# Patient Record
Sex: Female | Born: 1970 | Race: White | Hispanic: No | Marital: Married | Smoking: Current every day smoker
Health system: Southern US, Community
[De-identification: ages and names within clinical notes are randomized; demographics above are authoritative.]

## PROBLEM LIST (undated history)

## (undated) DIAGNOSIS — Q278 Other specified congenital malformations of peripheral vascular system: Secondary | ICD-10-CM

---

## 2004-03-15 ENCOUNTER — Other Ambulatory Visit: Payer: Self-pay

## 2004-12-02 ENCOUNTER — Emergency Department: Payer: Self-pay | Admitting: Emergency Medicine

## 2010-03-18 ENCOUNTER — Emergency Department: Payer: Self-pay | Admitting: Unknown Physician Specialty

## 2011-02-28 ENCOUNTER — Emergency Department: Payer: Self-pay | Admitting: Unknown Physician Specialty

## 2011-02-28 ENCOUNTER — Emergency Department: Payer: Self-pay | Admitting: Emergency Medicine

## 2012-04-02 ENCOUNTER — Emergency Department: Payer: Self-pay | Admitting: Emergency Medicine

## 2012-05-01 ENCOUNTER — Emergency Department: Payer: Self-pay | Admitting: Emergency Medicine

## 2012-05-01 LAB — BASIC METABOLIC PANEL
BUN: 12 mg/dL (ref 7–18)
Calcium, Total: 8.1 mg/dL — ABNORMAL LOW (ref 8.5–10.1)
Chloride: 109 mmol/L — ABNORMAL HIGH (ref 98–107)
Creatinine: 0.68 mg/dL (ref 0.60–1.30)
Glucose: 100 mg/dL — ABNORMAL HIGH (ref 65–99)
Osmolality: 281 (ref 275–301)
Potassium: 3.6 mmol/L (ref 3.5–5.1)
Sodium: 141 mmol/L (ref 136–145)

## 2012-05-01 LAB — PROTIME-INR
INR: 1.5
Prothrombin Time: 18.1 secs — ABNORMAL HIGH (ref 11.5–14.7)

## 2012-05-01 LAB — CBC
MCH: 28.6 pg (ref 26.0–34.0)
MCHC: 33.6 g/dL (ref 32.0–36.0)
MCV: 85 fL (ref 80–100)
Platelet: 176 10*3/uL (ref 150–440)
RDW: 13.7 % (ref 11.5–14.5)

## 2012-10-14 ENCOUNTER — Emergency Department: Payer: Self-pay | Admitting: Emergency Medicine

## 2012-11-11 ENCOUNTER — Emergency Department: Payer: Self-pay | Admitting: Emergency Medicine

## 2012-11-11 LAB — RAPID INFLUENZA A&B ANTIGENS

## 2013-02-28 ENCOUNTER — Emergency Department: Payer: Self-pay | Admitting: Emergency Medicine

## 2013-03-02 ENCOUNTER — Emergency Department: Payer: Self-pay | Admitting: Emergency Medicine

## 2013-05-24 ENCOUNTER — Emergency Department: Payer: Self-pay | Admitting: Emergency Medicine

## 2013-06-26 ENCOUNTER — Emergency Department: Payer: Self-pay | Admitting: Internal Medicine

## 2013-06-26 LAB — PROTIME-INR: Prothrombin Time: 18.3 secs — ABNORMAL HIGH (ref 11.5–14.7)

## 2013-06-26 LAB — BASIC METABOLIC PANEL
Calcium, Total: 8.7 mg/dL (ref 8.5–10.1)
Chloride: 108 mmol/L — ABNORMAL HIGH (ref 98–107)
Co2: 19 mmol/L — ABNORMAL LOW (ref 21–32)
Creatinine: 0.6 mg/dL (ref 0.60–1.30)
Glucose: 93 mg/dL (ref 65–99)
Osmolality: 275 (ref 275–301)

## 2013-06-26 LAB — CBC WITH DIFFERENTIAL/PLATELET
Basophil %: 1 %
Eosinophil %: 2.4 %
HCT: 42 % (ref 35.0–47.0)
HGB: 14.8 g/dL (ref 12.0–16.0)
Lymphocyte #: 1.4 10*3/uL (ref 1.0–3.6)
MCV: 84 fL (ref 80–100)
Neutrophil %: 72.4 %
Platelet: 209 10*3/uL (ref 150–440)
WBC: 8.1 10*3/uL (ref 3.6–11.0)

## 2013-10-02 IMAGING — US US EXTREM LOW VENOUS*L*
1 series · 14 of 24 positions shown · non-contrast
Comparison: none

REASON FOR EXAM: pain/edema non-palble Post-Tibial pulse
COMMENTS:   LMP: BTL

[Series 1: us extrem low venous*left* · 0.09mm/px · 14 of 26 slices shown]
[im 1/26]
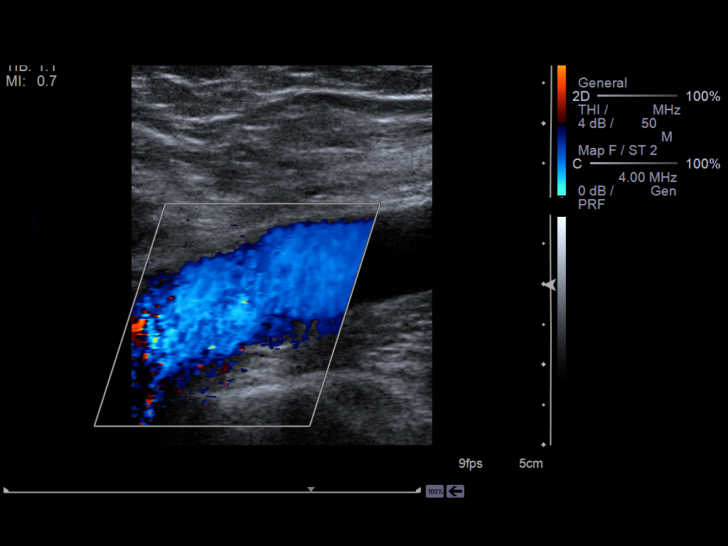
[im 3/26]
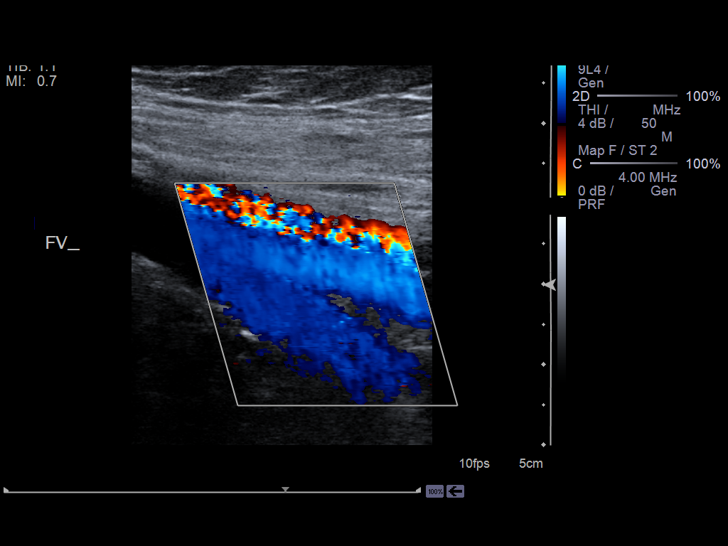
[im 5/26]
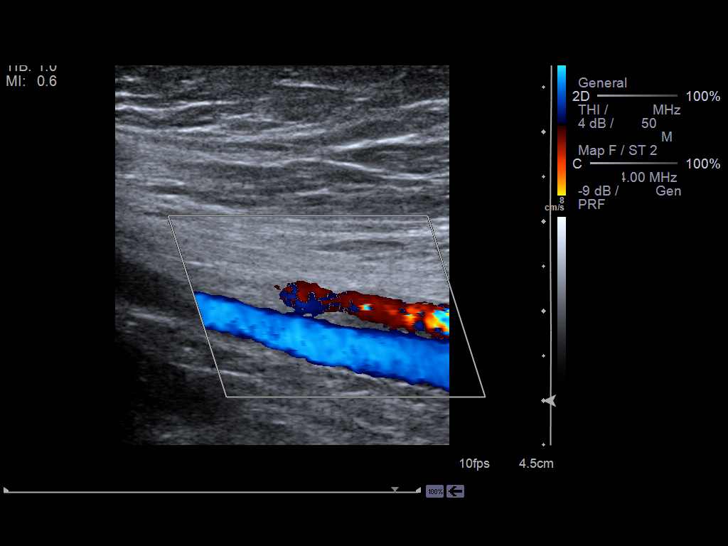
[im 7/26]
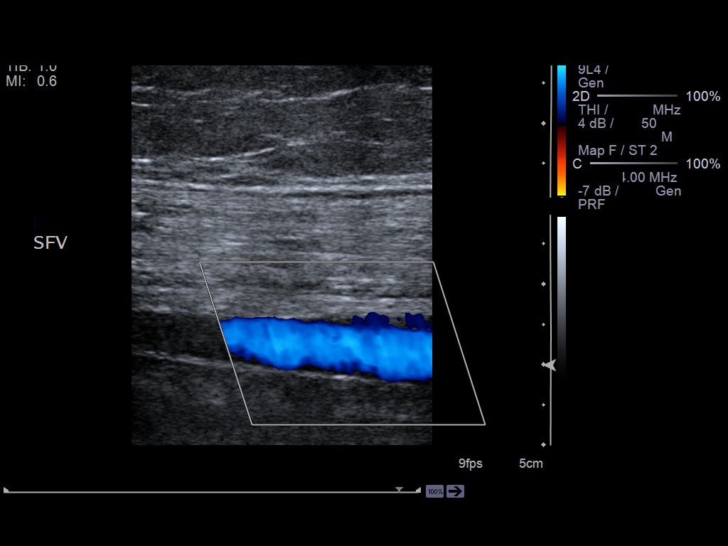
[im 8/26]
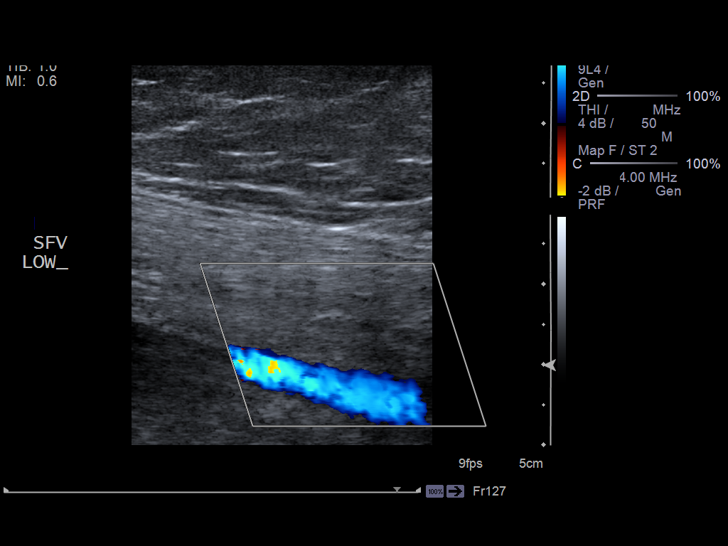
[im 10/26]
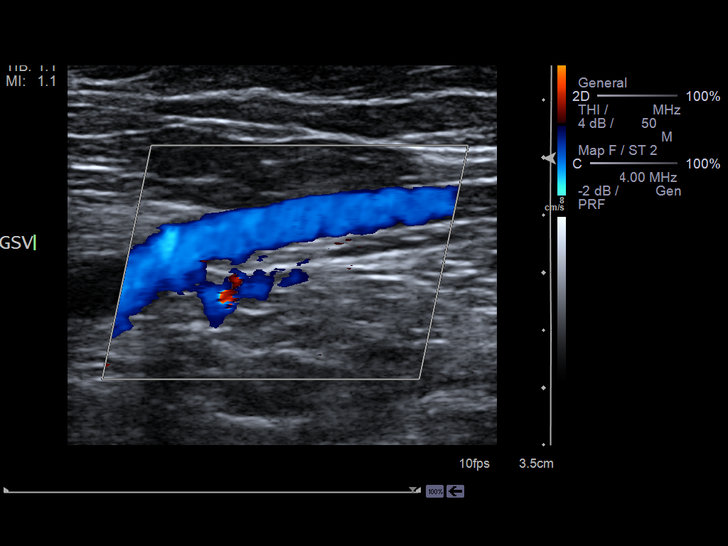
[im 12/26]
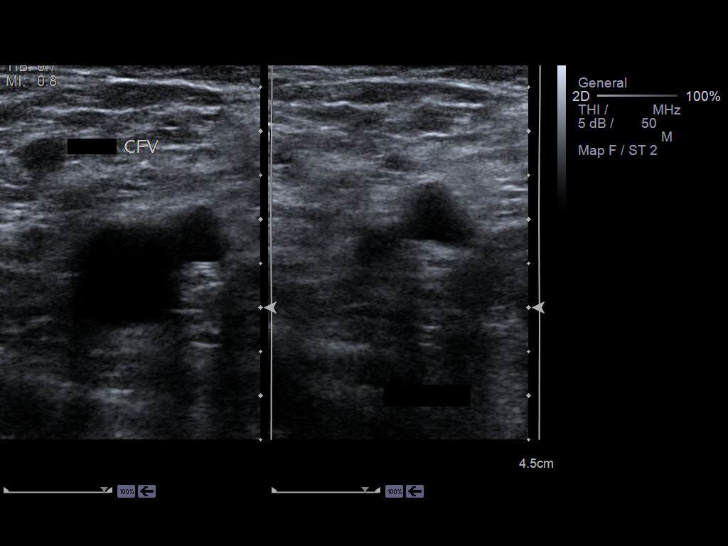
[im 14/26]
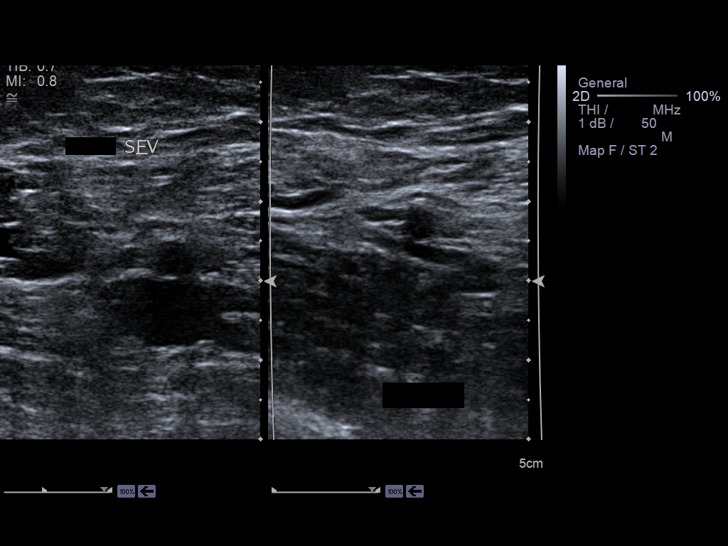
[im 16/26]
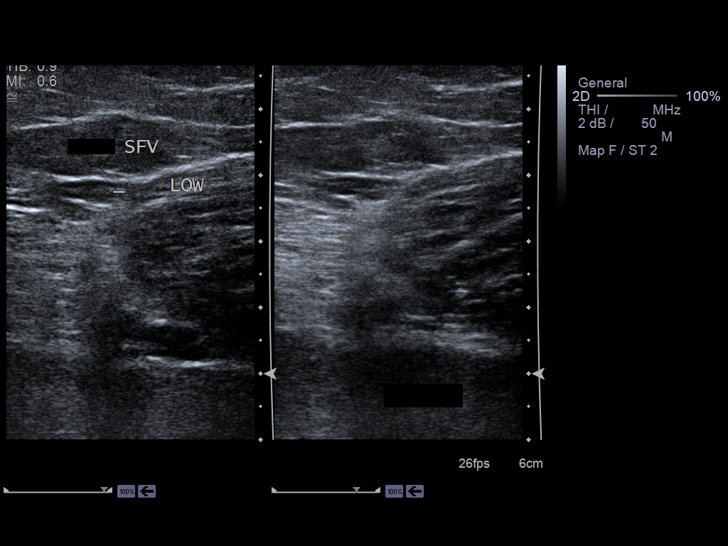
[im 18/26]
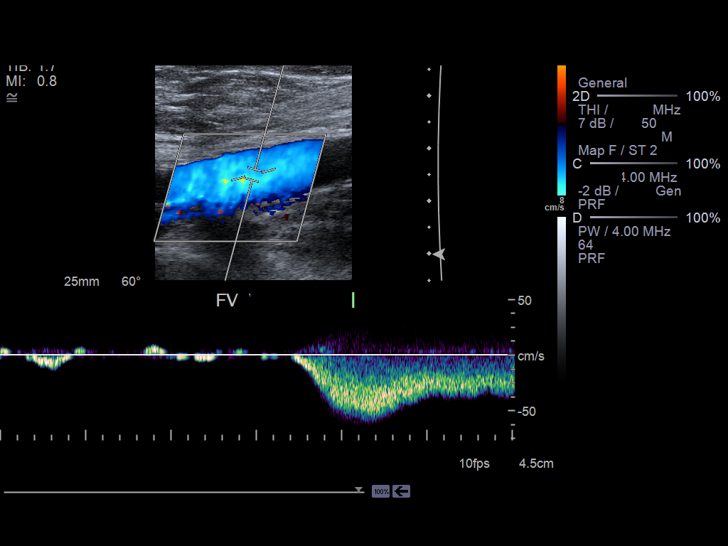
[im 20/26]
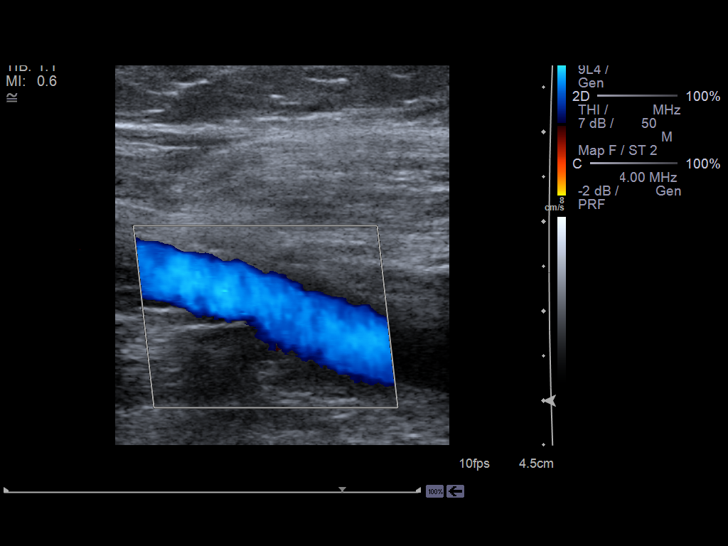
[im 21/26]
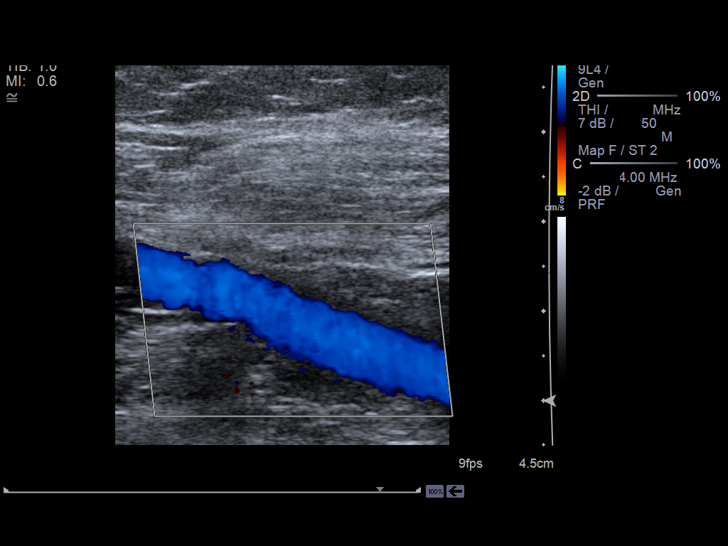
[im 23/26]
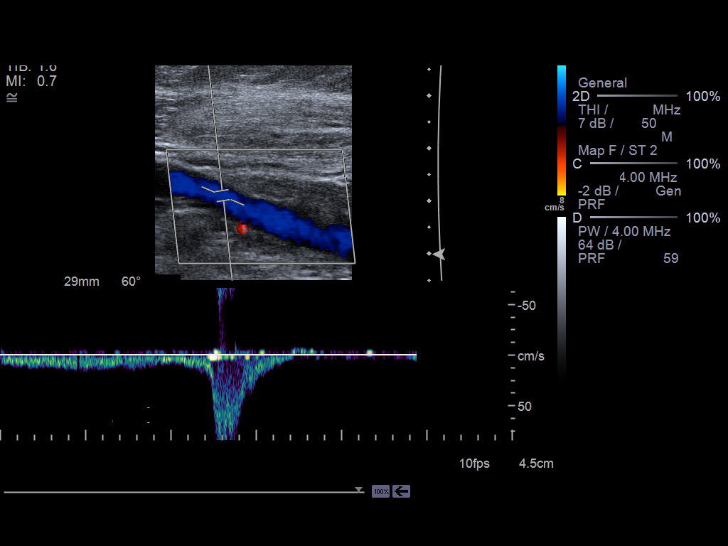
[im 26/26]
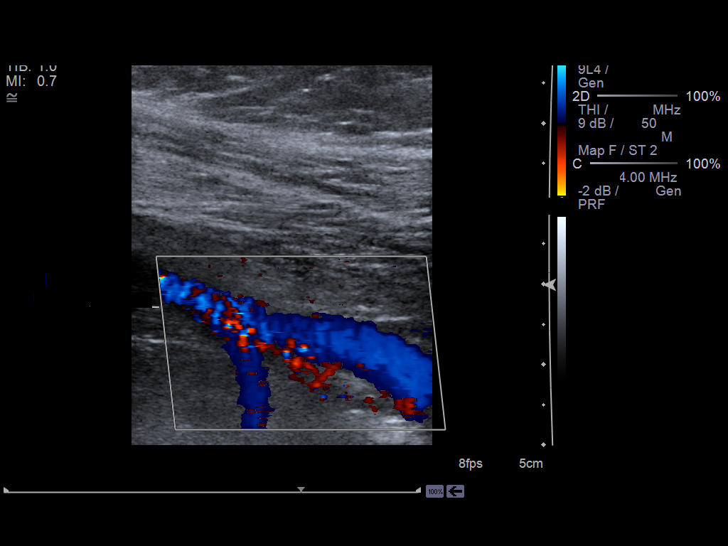

[14 of 24 positions shown; findings below may reference images not displayed]

PROCEDURE:     US  - US DOPPLER LOW EXTR LEFT  - October 14, 2012 [DATE]

RESULT:     Grayscale and color flow Doppler techniques were employed to
evaluate the deep veins of the left lower extremity.

The left common femoral, superficial femoral, popliteal veins are normally
compressible. The waveform patterns are normal and the color flow images are
normal. The response to the augmentation and Valsalva maneuvers is normal.
IMPRESSION: There is no evidence of thrombus within the left femoral or
popliteal veins.

A preliminary report was sent to the [HOSPITAL] the conclusion
of the study.

[REDACTED]

## 2015-09-21 ENCOUNTER — Encounter: Payer: Self-pay | Admitting: *Deleted

## 2015-09-21 ENCOUNTER — Emergency Department
Admission: EM | Admit: 2015-09-21 | Discharge: 2015-09-21 | Disposition: A | Discharge status: Home / Self Care | Payer: Medicare Other | Attending: Emergency Medicine | Admitting: Emergency Medicine

## 2015-09-21 ENCOUNTER — Emergency Department: Payer: Medicare Other

## 2015-09-21 DIAGNOSIS — Y9389 Activity, other specified: Secondary | ICD-10-CM | POA: Diagnosis not present

## 2015-09-21 DIAGNOSIS — Y92009 Unspecified place in unspecified non-institutional (private) residence as the place of occurrence of the external cause: Secondary | ICD-10-CM | POA: Insufficient documentation

## 2015-09-21 DIAGNOSIS — S59902A Unspecified injury of left elbow, initial encounter: Secondary | ICD-10-CM | POA: Diagnosis present

## 2015-09-21 DIAGNOSIS — M79632 Pain in left forearm: Secondary | ICD-10-CM

## 2015-09-21 DIAGNOSIS — M7989 Other specified soft tissue disorders: Secondary | ICD-10-CM

## 2015-09-21 DIAGNOSIS — F17200 Nicotine dependence, unspecified, uncomplicated: Secondary | ICD-10-CM | POA: Insufficient documentation

## 2015-09-21 DIAGNOSIS — Y998 Other external cause status: Secondary | ICD-10-CM | POA: Insufficient documentation

## 2015-09-21 DIAGNOSIS — S5002XA Contusion of left elbow, initial encounter: Secondary | ICD-10-CM

## 2015-09-21 DIAGNOSIS — W228XXA Striking against or struck by other objects, initial encounter: Secondary | ICD-10-CM | POA: Diagnosis not present

## 2015-09-21 DIAGNOSIS — S59912A Unspecified injury of left forearm, initial encounter: Secondary | ICD-10-CM | POA: Diagnosis not present

## 2015-09-21 HISTORY — DX: Other specified congenital malformations of peripheral vascular system: Q27.8

## 2015-09-21 MED ORDER — KETOROLAC TROMETHAMINE 10 MG PO TABS: 10.0000 mg | ORAL_TABLET | Freq: Three times a day (TID) | ORAL | Status: AC

## 2015-09-21 MED ORDER — KETOROLAC TROMETHAMINE 60 MG/2ML IM SOLN
60.0000 mg | Freq: Once | INTRAMUSCULAR | Status: AC
  Administered 2015-09-21: 60 mg via INTRAMUSCULAR
  Filled 2015-09-21: qty 2

## 2015-09-21 MED ORDER — CYCLOBENZAPRINE HCL 5 MG PO TABS: 5.0000 mg | ORAL_TABLET | Freq: Three times a day (TID) | ORAL | Status: AC | PRN

## 2015-09-21 NOTE — ED Notes (Signed)
Per pt pharmacy ketorolac not covered by pt insurance. [facility] [place] [facility] , one by mouth twice daily, dispense 30. Called to pt pharmacy.

## 2015-09-21 NOTE — ED Notes (Signed)
Per pt pharmacy, ketorolac not covered by insurance provider. [facility] [place] [facility], one by mouth twice daily, dispense 30. Order phoned to pt pharmacy.

## 2015-09-21 NOTE — ED Notes (Signed)
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

## 2015-09-21 NOTE — ED Provider Notes (Signed)
[facility] Emergency Department Provider Note ____________________________________________  Time seen: 1515  I have reviewed the triage vital signs and the nursing notes.  HISTORY  Chief Complaint  Arm Pain  HPI Kara Kaiser is a 45 y.o. female presents to the ED for evaluation of left arm pain at that she accidentally bumped her elbow about 2 weeks prior. She describes she was at home, did not actually hit the back part of her elbow on the wall. Since that time she's had some increased pain at the elbow as well as some swelling that she describes into the palm of the hand. She describes an unusual disorder that she calls the [facility], which stands for venous mucocutaneous malformations. She describes the disorder causes her to develop tumors in her veins and on her nerves or tendons. She thinks that the disorder may have flare following her operation with a contusion. She denies any change in the last 2 weeks with her daily dosing of morphine and Percocet. She has tried ibuprofen without relief. She reports here to the ED she claims for perhaps a prescription for anti-inflammatory or something that might help her pain. She is not been evaluated by her primary care provider since the injury. She claims is also the first time she's had such a contusion since her diagnosis in 2004. She claims pains her to extend the arm fully causing pain not as much in the elbow as it is into the forearm and swelling into the hand. She rates her pain at 10/10 in triage.  Past Medical History  Diagnosis Date  . Multiple cutaneous and mucosal venous malformations    There are no active problems to display for this patient.  History reviewed. No pertinent past surgical history.  Current Outpatient Rx  Name  Route  Sig  Dispense  Refill  . cyclobenzaprine (FLEXERIL) 5 MG tablet   Oral   Take 1 tablet (5 mg total) by mouth 3 (three) times daily as needed for muscle spasms.   15 tablet    0   . ketorolac (TORADOL) 10 MG tablet   Oral   Take 1 tablet (10 mg total) by mouth every 8 (eight) hours.   15 tablet   0    Allergies Review of patient's allergies indicates no known allergies.  History reviewed. No pertinent family history.  Social History Social History  Substance Use Topics  . Smoking status: Current Every Day Smoker  . Smokeless tobacco: None  . Alcohol Use: None   Review of Systems  Constitutional: Negative for fever. Eyes: Negative for visual changes. ENT: Negative for sore throat. Cardiovascular: Negative for chest pain. Respiratory: Negative for shortness of breath. Gastrointestinal: Negative for abdominal pain, vomiting and diarrhea. Genitourinary: Negative for dysuria. Musculoskeletal: Negative for back pain. Left arm pain and disability as above Skin: Negative for rash. Neurological: Negative for headaches, focal weakness or numbness. ____________________________________________  PHYSICAL EXAM:  VITAL SIGNS: ED Triage Vitals  Enc Vitals Group     BP 09/21/15 1346 134/84 mmHg     Pulse Rate 09/21/15 1346 85     Resp 09/21/15 1346 18     Temp 09/21/15 1346 98.1 F (36.7 C)     Temp Source 09/21/15 1346 Oral     SpO2 09/21/15 1346 98 %     Weight 09/21/15 1346 175 lb (79.379 kg)     Height 09/21/15 1346 5\' 4"  (1.626 m)     Head Cir --  Peak Flow --      Pain Score 09/21/15 1347 10     Pain Loc --      Pain Edu? --      Excl. in GC? --    Constitutional: Alert and oriented. Well appearing and in no distress. Head: Normocephalic and atraumatic.      Eyes: Conjunctivae are normal. PERRL. Normal extraocular movements      Ears: Canals clear. TMs intact bilaterally.   Nose: No congestion/rhinorrhea.   Mouth/Throat: Mucous membranes are moist.   Neck: Supple. No thyromegaly. Hematological/Lymphatic/Immunological: No cervical lymphadenopathy. Cardiovascular: Normal rate, regular rhythm. Normal distal pulses and cap  refill Respiratory: Normal respiratory effort. No wheezes/rales/rhonchi. Gastrointestinal: Soft and nontender. No distention. Musculoskeletal: Left upper extremity without any obvious deformity, swelling, edema, or erythema. Patient with normal shoulder range of motion exam. She is also able to demonstrate normal flexion and near full extension range at the elbow. Nontender with normal range of motion in all extremities.  Neurologic: Cranial nerves II through XII grossly intact. Normal intrinsic and opposition testing. Normal composite fist on the left. Normal UE DTRs bilaterally. Normal gait without ataxia. Normal speech and language. No gross focal neurologic deficits are appreciated. Skin:  Skin is warm, dry and intact. No rash noted. Psychiatric: Mood and affect are normal. Patient exhibits appropriate insight and judgment. ____________________________________________   RADIOLOGY LUE Venous Doppler IMPRESSION: No evidence of left upper extremity deep venous thrombosis. Right subclavian vein also patent. ____________________________________________  PROCEDURES  Toradol 60 mg IM ____________________________________________  INITIAL IMPRESSION / ASSESSMENT AND PLAN / ED COURSE  Patient with an elbow contusion and prescription of complaints of swelling to the distal forearm. She is unable normal physical exam and an upper extremity venous Doppler without evidence of DVT. She will be discharged with a prescription for ketorolac and cyclobenzaprine pills as directed. She'll continue with her home medications including morphine and Percocet daily. She will follow-up with her primary care provider for ongoing symptoms. ____________________________________________  FINAL CLINICAL IMPRESSION(S) / ED DIAGNOSES  Final diagnoses:  Pain and swelling of forearm, left  Left elbow contusion, initial encounter      Lissa Hoard, PA-C 09/21/15 1734  Loleta Rose, MD 09/21/15 2358

## 2015-09-21 NOTE — Discharge Instructions (Signed)
Elbow Contusion  An elbow contusion is a deep bruise of the elbow. Contusions happen when an injury causes bleeding under the skin. Signs of bruising include pain, puffiness (swelling), and discolored skin. The contusion may turn blue, purple, or yellow. HOME CARE  Put ice on the injured area.  Put ice in a plastic bag.  Place a towel between your skin and the bag.  Leave the ice on for 15-20 minutes, 03-04 times a day.  Only take medicines as told by your doctor.  Rest your elbow until the pain and puffiness are better.  Raise (elevate) your elbow to lessen puffiness.  Put on an elastic wrap as told by your doctor. You can take it off for sleeping, showers, and baths. If your fingers get cold, blue, or lose feeling (numb), take the wrap off. Put the wrap back on more loosely.  Use your elbow only as told by your doctor. If you are asked to do elbow exercises, do them as told.  Keep all doctor visits as told. GET HELP RIGHT AWAY IF:  You have more redness, puffiness, or pain in your elbow.  Your puffiness or pain is not helped by medicines.  You have puffiness of the hand and fingers.  You are not able to move your fingers or wrist.  You start to lose feeling in your hand or fingers.  Your fingers or hand become cold or blue. MAKE SURE YOU:   Understand these instructions.  Will watch your condition.  Will get help right away if you are not doing well or get worse.   This information is not intended to replace advice given to you by your health care provider. Make sure you discuss any questions you have with your health care provider.   Document Released: 08/15/2011 Document Revised: 02/25/2012 Document Reviewed: 04/10/2015 Elsevier Interactive Patient Education 2016 [employer]  Musculoskeletal Pain Musculoskeletal pain is muscle and boney aches and pains. These pains can occur in any part of the body. Your caregiver may treat you without knowing the cause of the  pain. They may treat you if blood or urine tests, X-rays, and other tests were normal.  CAUSES There is often not a definite cause or reason for these pains. These pains may be caused by a type of germ (virus). The discomfort may also come from overuse. Overuse includes working out too hard when your body is not fit. Boney aches also come from weather changes. Bone is sensitive to atmospheric pressure changes. HOME CARE INSTRUCTIONS   Ask when your test results will be ready. Make sure you get your test results.  Only take over-the-counter or prescription medicines for pain, discomfort, or fever as directed by your caregiver. If you were given medications for your condition, do not drive, operate machinery or power tools, or sign legal documents for 24 hours. Do not drink alcohol. Do not take sleeping pills or other medications that may interfere with treatment.  Continue all activities unless the activities cause more pain. When the pain lessens, slowly resume normal activities. Gradually increase the intensity and duration of the activities or exercise.  During periods of severe pain, bed rest may be helpful. Lay or sit in any position that is comfortable.  Putting ice on the injured area.  Put ice in a bag.  Place a towel between your skin and the bag.  Leave the ice on for 15 to 20 minutes, 3 to 4 times a day.  Follow up with your caregiver  for continued problems and no reason can be found for the pain. If the pain becomes worse or does not go away, it may be necessary to repeat tests or do additional testing. Your caregiver may need to look further for a possible cause. SEEK IMMEDIATE MEDICAL CARE IF:  You have pain that is getting worse and is not relieved by medications.  You develop chest pain that is associated with shortness or breath, sweating, feeling sick to your stomach (nauseous), or throw up (vomit).  Your pain becomes localized to the abdomen.  You develop any new  symptoms that seem different or that concern you. MAKE SURE YOU:   Understand these instructions.  Will watch your condition.  Will get help right away if you are not doing well or get worse.   This information is not intended to replace advice given to you by your health care provider. Make sure you discuss any questions you have with your health care provider.   Document Released: 08/26/2005 Document Revised: 11/18/2011 Document Reviewed: 04/30/2013 Elsevier Interactive Patient Education [employer]  Your exam and ultrasound was normal today. Take your home medicines and the prescribed anti-inflammatory + muscle relaxant as needed. Follow-up with your provider for ongoing symptoms.

## 2015-09-21 NOTE — ED Notes (Signed)
Assess per PA 

## 2016-09-08 IMAGING — US US EXTREM  UP VENOUS*L*
1 series · 13 of 24 positions shown · non-contrast
Comparison: None.

CLINICAL DATA: Left upper extremity pain and edema for 2 weeks

EXAM:
LEFT UPPER EXTREMITY VENOUS DUPLEX ULTRASOUND
TECHNIQUE: Gray-scale sonography with graded compression, as well as color
Doppler and duplex ultrasound were performed to evaluate the left
upper extremity deep venous system from the level of the subclavian
vein and including the jugular, axillary, basilic, radial, ulnar and
upper cephalic vein. Spectral Doppler was utilized to evaluate flow
at rest and with distal augmentation maneuvers.

[Series 1: us extrem up venous*left* · 0.07mm/px · 13 of 33 slices shown]
[im 1/33]
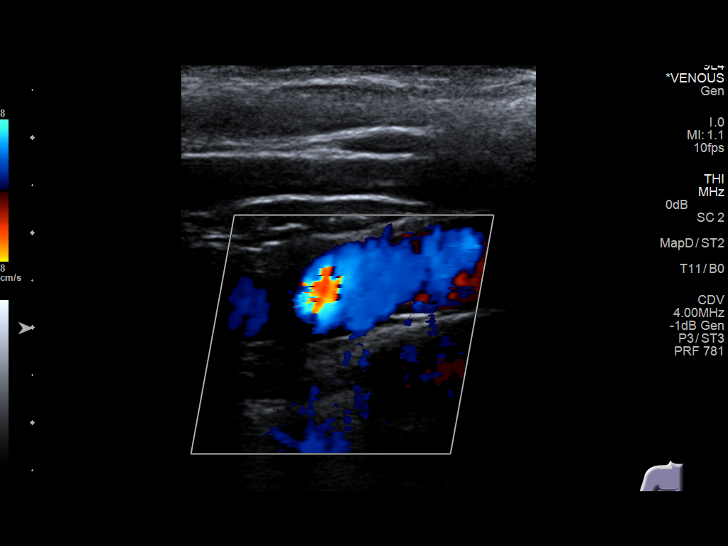
[im 3/33]
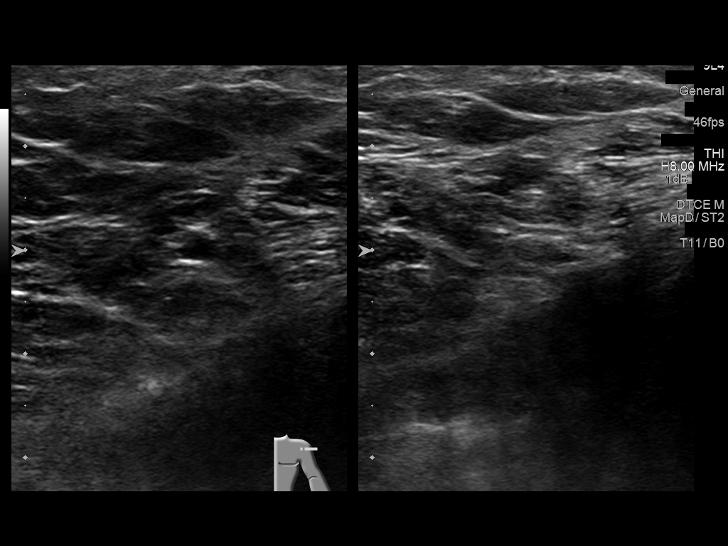
[im 6/33]
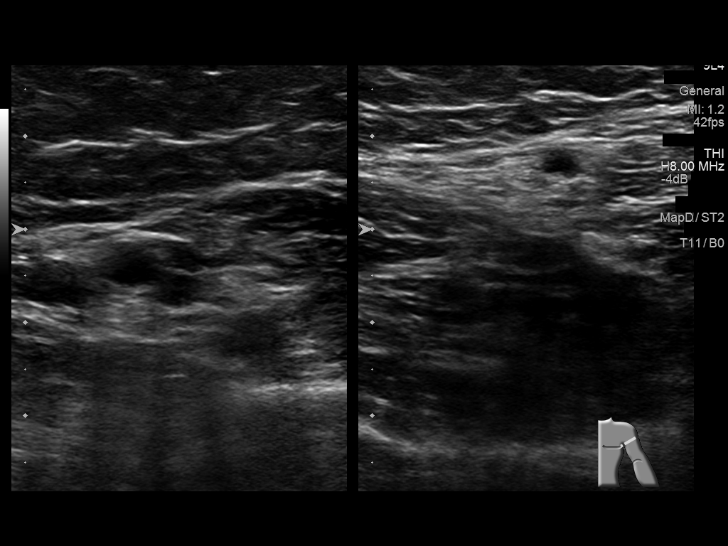
[im 9/33]
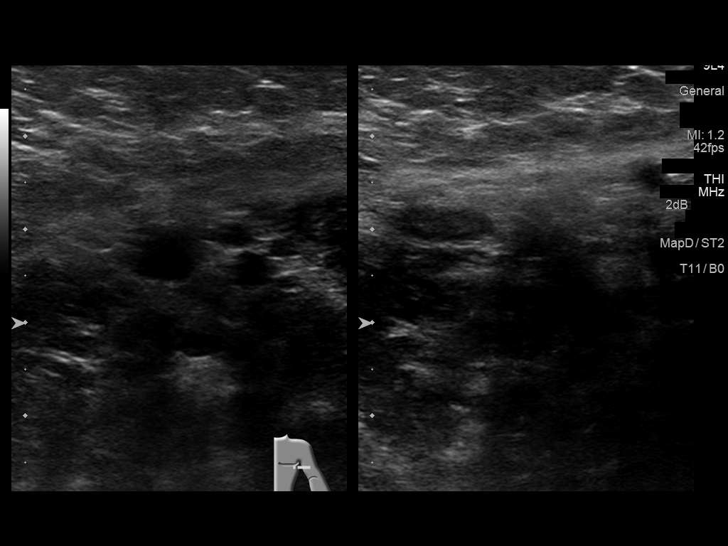
[im 12/33]
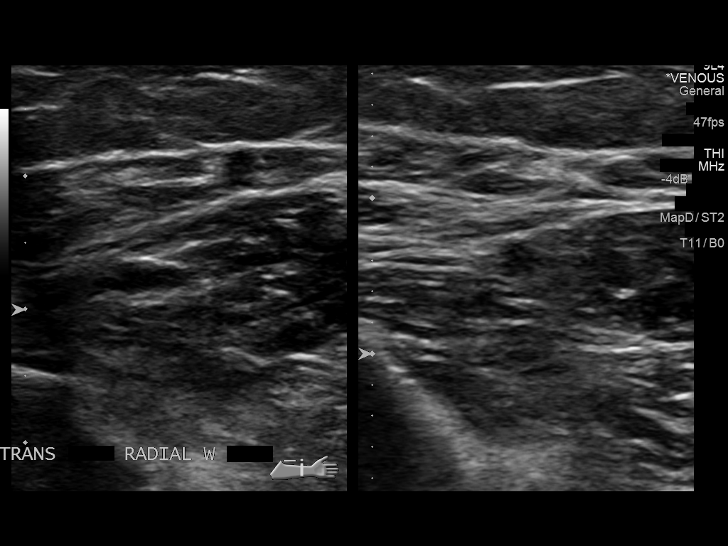
[im 14/33]
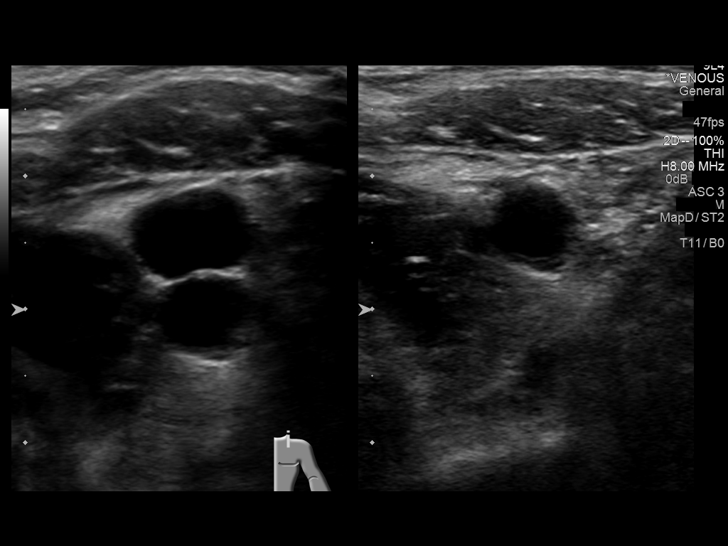
[im 17/33]
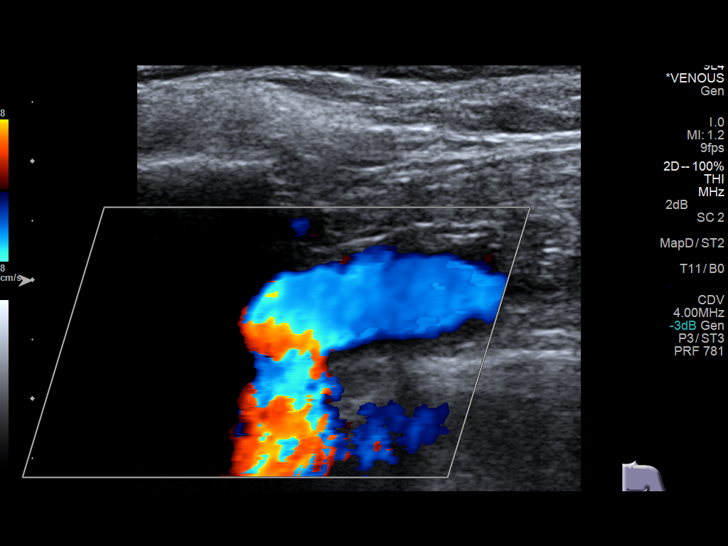
[im 19/33]
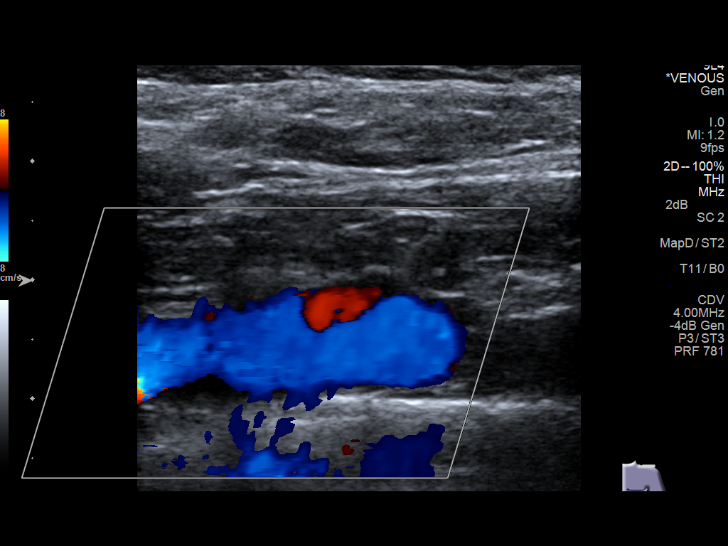
[im 21/33]
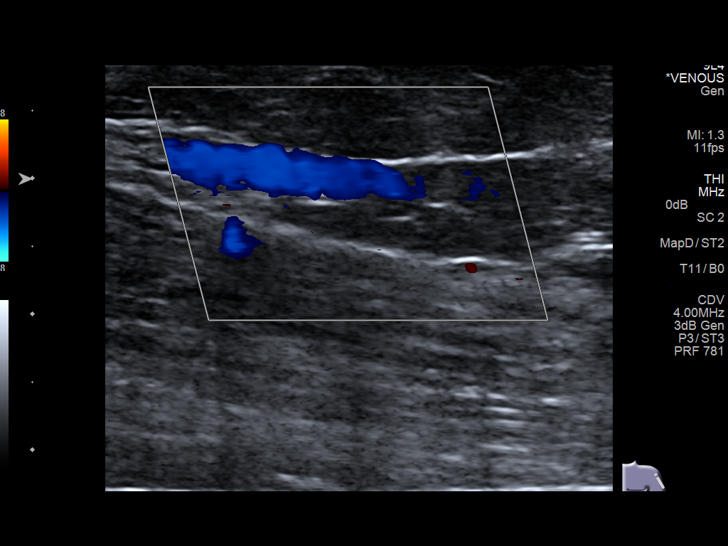
[im 24/33]
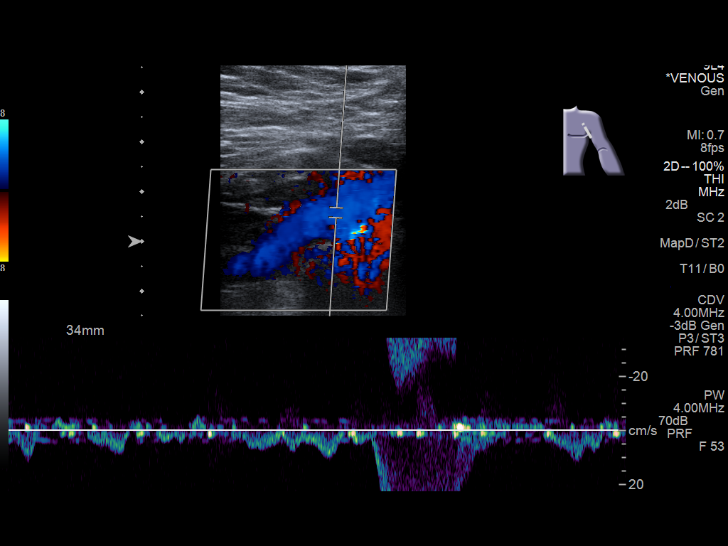
[im 27/33]
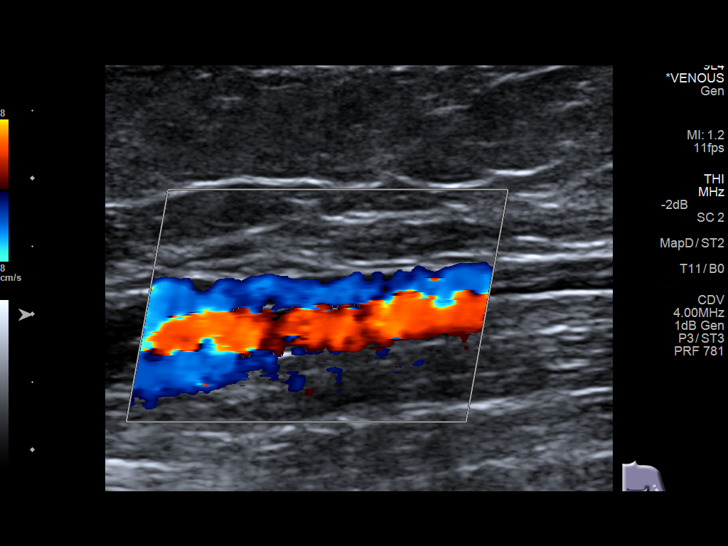
[im 30/33]
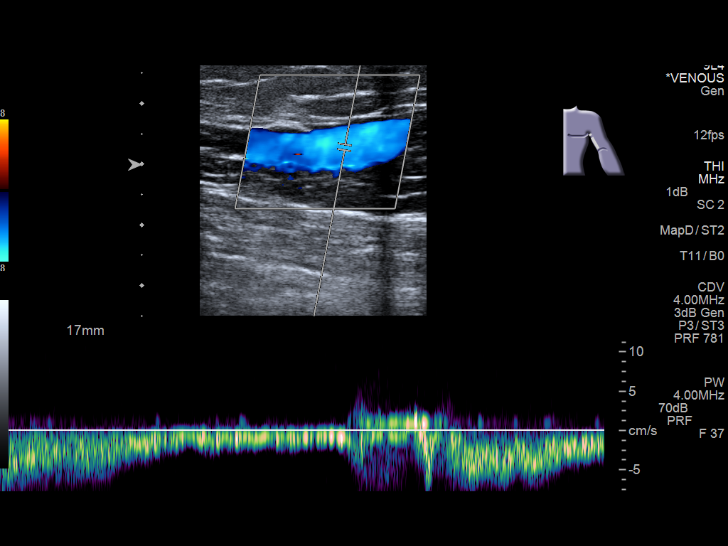
[im 33/33]
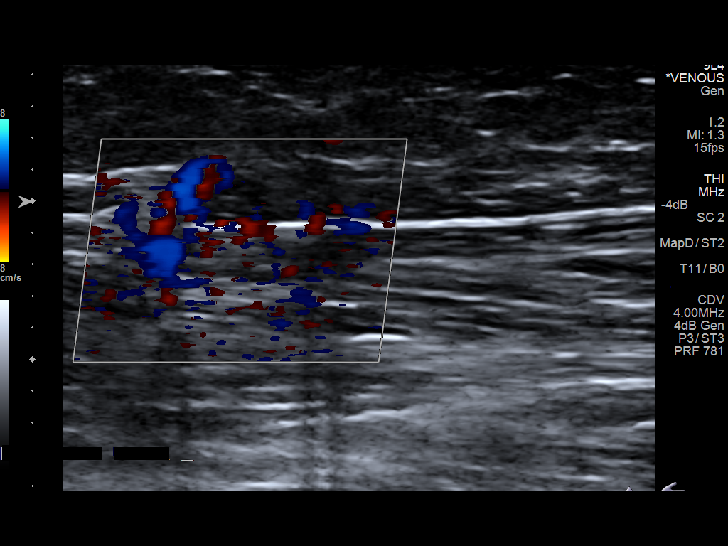

[13 of 24 positions shown; findings below may reference images not displayed]

FINDINGS: Contralateral Subclavian Vein: Respiratory phasicity is normal and
symmetric with the symptomatic side. No evidence of thrombus. Normal
compressibility.

Internal Jugular Vein: No evidence of thrombus. Normal
compressibility, respiratory phasicity and response to augmentation.

Subclavian Vein: No evidence of thrombus. Normal compressibility,
respiratory phasicity and response to augmentation.

Axillary Vein: No evidence of thrombus. Normal compressibility,
respiratory phasicity and response to augmentation.

Cephalic Vein: No evidence of thrombus. Normal compressibility,
respiratory phasicity and response to augmentation.

Basilic Vein: No evidence of thrombus. Normal compressibility,
respiratory phasicity and response to augmentation.

Brachial Veins: No evidence of thrombus. Normal compressibility,
respiratory phasicity and response to augmentation.

Radial Veins: No evidence of thrombus. Normal compressibility,
respiratory phasicity and response to augmentation.

Ulnar Veins: No evidence of thrombus. Normal compressibility,
respiratory phasicity and response to augmentation.

Venous Reflux:  None visualized.

Other Findings:  None visualized.
IMPRESSION: No evidence of left upper extremity deep venous thrombosis. Right
subclavian vein also patent.

## 2017-06-17 ENCOUNTER — Encounter: Payer: Self-pay | Admitting: *Deleted

## 2017-06-17 ENCOUNTER — Emergency Department
Admission: EM | Admit: 2017-06-17 | Discharge: 2017-06-17 | Disposition: A | Discharge status: Home / Self Care | Payer: Medicare Other | Attending: Emergency Medicine | Admitting: Emergency Medicine

## 2017-06-17 DIAGNOSIS — N3001 Acute cystitis with hematuria: Secondary | ICD-10-CM | POA: Diagnosis not present

## 2017-06-17 DIAGNOSIS — F17210 Nicotine dependence, cigarettes, uncomplicated: Secondary | ICD-10-CM | POA: Diagnosis not present

## 2017-06-17 DIAGNOSIS — R300 Dysuria: Secondary | ICD-10-CM | POA: Diagnosis present

## 2017-06-17 DIAGNOSIS — Z79899 Other long term (current) drug therapy: Secondary | ICD-10-CM | POA: Insufficient documentation

## 2017-06-17 LAB — URINALYSIS, COMPLETE (UACMP) WITH MICROSCOPIC
Bilirubin Urine: NEGATIVE
GLUCOSE, UA: NEGATIVE mg/dL
KETONES UR: NEGATIVE mg/dL
Nitrite: POSITIVE — AB
PROTEIN: 30 mg/dL — AB
Specific Gravity, Urine: 1.009 (ref 1.005–1.030)
pH: 5 (ref 5.0–8.0)

## 2017-06-17 MED ORDER — CEPHALEXIN 500 MG PO CAPS: 500.0000 mg | ORAL_CAPSULE | Freq: Four times a day (QID) | ORAL | 0 refills | Status: AC

## 2017-06-17 MED ORDER — PHENAZOPYRIDINE HCL 100 MG PO TABS: 100.0000 mg | ORAL_TABLET | Freq: Three times a day (TID) | ORAL | 0 refills | Status: AC | PRN

## 2017-06-17 NOTE — ED Triage Notes (Signed)
Pt complains of dysuria since Sunday

## 2017-06-17 NOTE — ED Notes (Signed)
Pt made aware the urine specimen is needed, pt states she just went and will need to drink her water before she can go again.  Pt asked to let a nurse know once she provides specimen.

## 2017-06-17 NOTE — ED Notes (Signed)

## 2017-06-17 NOTE — ED Provider Notes (Signed)
[facility] Emergency Department Provider Note  ____________________________________________  Time seen: Approximately 7:17 PM  I have reviewed the triage vital signs and the nursing notes.   HISTORY  Chief Complaint Urinary Tract Infection    HPI Kara Kaiser is a 46 y.o. female that presents to the emergency department for evaluation of dysuria and frequency of urination for 3 days. She states that this feels the exact same as previous urinary tract infections. She has tried Azo without relief. She has never had a kidney stone. Last menstrual period was sometime in September. No fever, chills, nausea, vomiting, abdominal pain, flank pain, back pain.   Past Medical History:  Diagnosis Date  . Multiple cutaneous and mucosal venous malformations     There are no active problems to display for this patient.   History reviewed. No pertinent surgical history.  Prior to Admission medications   Medication Sig Start Date End Date Taking? Authorizing Provider  cephALEXin (KEFLEX) 500 MG capsule Take 1 capsule (500 mg total) by mouth 4 (four) times daily. 06/17/17 06/27/17  Enid Derry, PA-C  cyclobenzaprine (FLEXERIL) 5 MG tablet Take 1 tablet (5 mg total) by mouth 3 (three) times daily as needed for muscle spasms. 09/21/15   Menshew, Charlesetta Ivory, PA-C  ketorolac (TORADOL) 10 MG tablet Take 1 tablet (10 mg total) by mouth every 8 (eight) hours. 09/21/15   Menshew, Charlesetta Ivory, PA-C  phenazopyridine (PYRIDIUM) 100 MG tablet Take 1 tablet (100 mg total) by mouth 3 (three) times daily as needed for pain. 06/17/17 06/19/17  Enid Derry, PA-C    Allergies Patient has no known allergies.  No family history on file.  Social History Social History  Substance Use Topics  . Smoking status: Current Every Day Smoker  . Smokeless tobacco: Not on file  . Alcohol use Not on file     Review of Systems  Constitutional: No fever/chills Cardiovascular: No  chest pain. Respiratory: No SOB. Gastrointestinal: No abdominal pain.  No nausea, no vomiting.  Musculoskeletal: Negative for musculoskeletal pain. Skin: Negative for rash, abrasions, lacerations, ecchymosis.   ____________________________________________   PHYSICAL EXAM:  VITAL SIGNS: ED Triage Vitals [06/17/17 1800]  Enc Vitals Group     BP (!) 151/88     Pulse Rate 85     Resp 20     Temp 99.1 F (37.3 C)     Temp Source Oral     SpO2 100 %     Weight 180 lb (81.6 kg)     Height  (1.626 m)     Head Circumference      Peak Flow      Pain Score 5     Pain Loc      Pain Edu?      Excl. in GC?      Constitutional: Alert and oriented. Well appearing and in no acute distress. Eyes: Conjunctivae are normal. PERRL. EOMI. Head: Atraumatic. ENT:      Ears:      Nose: No congestion/rhinnorhea.      Mouth/Throat: Mucous membranes are moist.  Neck: No stridor.  Cardiovascular: Normal rate, regular rhythm.  Good peripheral circulation. Respiratory: Normal respiratory effort without tachypnea or retractions. Lungs CTAB. Good air entry to the bases with no decreased or absent breath sounds. Gastrointestinal: Bowel sounds 4 quadrants. Soft and nontender to palpation. No guarding or rigidity. No palpable masses. No distention. No CVA tenderness. Musculoskeletal: Full range of motion to all extremities. No gross  deformities appreciated. Neurologic:  Normal speech and language. No gross focal neurologic deficits are appreciated.  Skin:  Skin is warm, dry and intact. No rash noted.   ____________________________________________   LABS (all labs ordered are listed, but only abnormal results are displayed)  Labs Reviewed  URINALYSIS, COMPLETE (UACMP) WITH MICROSCOPIC - Abnormal; Notable for the following:       Result Value   Color, Urine AMBER (*)    APPearance CLEAR (*)    Hgb urine dipstick MODERATE (*)    Protein, ur 30 (*)    Nitrite POSITIVE (*)    Leukocytes, UA  SMALL (*)    Bacteria, UA RARE (*)    Squamous Epithelial / LPF 0-5 (*)    All other components within normal limits   ____________________________________________  EKG   ____________________________________________  RADIOLOGY   No results found.  ____________________________________________    PROCEDURES  Procedure(s) performed:    Procedures    Medications - No data to display   ____________________________________________   INITIAL IMPRESSION / ASSESSMENT AND PLAN / ED COURSE  Pertinent labs & imaging results that were available during my care of the patient were reviewed by me and considered in my medical decision making (see chart for details).  Review of the [place] CSRS was performed in accordance of the [facility] prior to dispensing any controlled drugs.   Patient's diagnosis is consistent with urinary tract infection. Vital signs and exam are reassuring. Urinalysis consistent with infection. Patient denies any back pain or abdominal pain. Patient will be discharged home with prescriptions for Keflex and Pyridium. Patient is to follow up with PCP as directed. Patient is given ED precautions to return to the ED for any worsening or new symptoms.     ____________________________________________  FINAL CLINICAL IMPRESSION(S) / ED DIAGNOSES  Final diagnoses:  Acute cystitis with hematuria      NEW MEDICATIONS STARTED DURING THIS VISIT:  Discharge Medication List as of 06/17/2017  7:31 PM    START taking these medications   Details  cephALEXin (KEFLEX) 500 MG capsule Take 1 capsule (500 mg total) by mouth 4 (four) times daily., Starting Tue 06/17/2017, Until Fri 06/27/2017, Print    phenazopyridine (PYRIDIUM) 100 MG tablet Take 1 tablet (100 mg total) by mouth 3 (three) times daily as needed for pain., Starting Tue 06/17/2017, Until Thu 06/19/2017, Print            This chart was dictated using voice recognition software/Dragon. Despite best efforts to  proofread, errors can occur which can change the meaning. Any change was purely unintentional.     Enid Derry, PA-C 06/17/17 2114    Minna Antis, MD 06/17/17 2308

## 2020-09-16 ENCOUNTER — Other Ambulatory Visit: Payer: Self-pay

## 2020-09-16 ENCOUNTER — Other Ambulatory Visit: Payer: Medicare Other

## 2020-09-16 DIAGNOSIS — Z20822 Contact with and (suspected) exposure to covid-19: Secondary | ICD-10-CM

## 2020-09-19 LAB — NOVEL CORONAVIRUS, NAA: SARS-CoV-2, NAA: DETECTED — AB
